# Patient Record
Sex: Female | Born: 1970 | Race: Black or African American | Hispanic: No | Marital: Single | Smoking: Former smoker
Health system: Southern US, Community
[De-identification: ages and names within clinical notes are randomized; demographics above are authoritative.]

## PROBLEM LIST (undated history)

## (undated) HISTORY — PX: OVARIAN CYST SURGERY: SHX726

---

## 1997-09-04 ENCOUNTER — Emergency Department (HOSPITAL_COMMUNITY): Admission: EM | Admit: 1997-09-04 | Discharge: 1997-09-04 | Payer: Self-pay

## 1997-09-05 ENCOUNTER — Encounter: Admission: RE | Admit: 1997-09-05 | Discharge: 1997-12-04 | Payer: Self-pay | Admitting: Internal Medicine

## 2001-07-28 ENCOUNTER — Other Ambulatory Visit: Admission: RE | Admit: 2001-07-28 | Discharge: 2001-07-28 | Payer: Self-pay | Admitting: Obstetrics and Gynecology

## 2002-08-08 ENCOUNTER — Other Ambulatory Visit: Admission: RE | Admit: 2002-08-08 | Discharge: 2002-08-08 | Payer: Self-pay | Admitting: Obstetrics and Gynecology

## 2003-01-23 ENCOUNTER — Emergency Department (HOSPITAL_COMMUNITY): Admission: EM | Admit: 2003-01-23 | Discharge: 2003-01-23 | Payer: Self-pay | Admitting: Emergency Medicine

## 2011-08-10 ENCOUNTER — Other Ambulatory Visit (HOSPITAL_COMMUNITY): Payer: Self-pay | Admitting: Obstetrics and Gynecology

## 2011-08-10 DIAGNOSIS — N979 Female infertility, unspecified: Secondary | ICD-10-CM

## 2011-08-17 ENCOUNTER — Ambulatory Visit (HOSPITAL_COMMUNITY)
Admission: RE | Admit: 2011-08-17 | Discharge: 2011-08-17 | Disposition: A | Discharge status: Home / Self Care | Payer: BC Managed Care – PPO | Source: Ambulatory Visit | Attending: Obstetrics and Gynecology | Admitting: Obstetrics and Gynecology

## 2011-08-17 DIAGNOSIS — N979 Female infertility, unspecified: Secondary | ICD-10-CM | POA: Insufficient documentation

## 2011-08-17 MED ORDER — IOHEXOL 300 MG/ML  SOLN: 5.0000 mL | Freq: Once | INTRAMUSCULAR | Status: AC | PRN

## 2014-10-12 ENCOUNTER — Telehealth: Payer: Self-pay | Admitting: Internal Medicine

## 2014-10-12 NOTE — Telephone Encounter (Signed)
Phone note entered in error 

## 2015-02-17 ENCOUNTER — Other Ambulatory Visit: Payer: Self-pay | Admitting: Obstetrics and Gynecology

## 2015-02-17 DIAGNOSIS — N63 Unspecified lump in unspecified breast: Secondary | ICD-10-CM

## 2015-02-21 ENCOUNTER — Ambulatory Visit
Admission: RE | Admit: 2015-02-21 | Discharge: 2015-02-21 | Disposition: A | Discharge status: Home / Self Care | Payer: BC Managed Care – PPO | Source: Ambulatory Visit | Attending: Obstetrics and Gynecology | Admitting: Obstetrics and Gynecology

## 2015-02-21 DIAGNOSIS — N63 Unspecified lump in unspecified breast: Secondary | ICD-10-CM

## 2016-01-04 ENCOUNTER — Emergency Department (HOSPITAL_COMMUNITY)
Admission: EM | Admit: 2016-01-04 | Discharge: 2016-01-04 | Disposition: A | Discharge status: Home / Self Care | Payer: BC Managed Care – PPO | Attending: Emergency Medicine | Admitting: Emergency Medicine

## 2016-01-04 ENCOUNTER — Encounter (HOSPITAL_COMMUNITY): Payer: Self-pay

## 2016-01-04 DIAGNOSIS — M5417 Radiculopathy, lumbosacral region: Secondary | ICD-10-CM | POA: Insufficient documentation

## 2016-01-04 DIAGNOSIS — Z87891 Personal history of nicotine dependence: Secondary | ICD-10-CM | POA: Diagnosis not present

## 2016-01-04 DIAGNOSIS — M545 Low back pain: Secondary | ICD-10-CM | POA: Diagnosis present

## 2016-01-04 MED ORDER — HYDROCODONE-ACETAMINOPHEN 5-325 MG PO TABS
2.0000 | ORAL_TABLET | Freq: Once | ORAL | Status: AC
  Administered 2016-01-04: 2 via ORAL
  Filled 2016-01-04: qty 2

## 2016-01-04 MED ORDER — PREDNISONE 10 MG PO TABS: 20.0000 mg | ORAL_TABLET | Freq: Two times a day (BID) | ORAL | 0 refills | Status: AC

## 2016-01-04 MED ORDER — HYDROCODONE-ACETAMINOPHEN 5-325 MG PO TABS: 1.0000 | ORAL_TABLET | Freq: Four times a day (QID) | ORAL | 0 refills | Status: AC | PRN

## 2016-01-04 MED ORDER — KETOROLAC TROMETHAMINE 60 MG/2ML IM SOLN
60.0000 mg | Freq: Once | INTRAMUSCULAR | Status: AC
  Administered 2016-01-04: 60 mg via INTRAMUSCULAR
  Filled 2016-01-04: qty 2

## 2016-01-04 NOTE — ED Provider Notes (Signed)
[facility]-EMERGENCY DEPT Provider Note   CSN: [id number] Arrival date & time: 01/04/16  1030     History   Chief Complaint Chief Complaint  Patient presents with  . Back Pain    HPI Margaret Soto is a 45 y.o. female.  Patient is a 45 year old female with no significant past medical history. She presents for evaluation of low back pain. This started several days ago in the absence of any injury or trauma. She denies any bowel or bladder complaints, but does report radiation into her right thigh. She denies any weakness but does have difficulty ambulating secondary to pain.   The history is provided by the patient.  Back Pain   This is a new problem. Episode onset: Several days ago. The problem occurs constantly. The problem has been gradually worsening. The pain is associated with no known injury. The pain is present in the lumbar spine. The quality of the pain is described as stabbing and shooting. The pain radiates to the right thigh. The pain is severe. The symptoms are aggravated by bending and twisting. Pertinent negatives include no numbness, no bowel incontinence, no bladder incontinence, no tingling and no weakness. She has tried NSAIDs for the symptoms. The treatment provided no relief.    History reviewed. No pertinent past medical history.  There are no active problems to display for this patient.   Past Surgical History:  Procedure Laterality Date  . OVARIAN CYST SURGERY      OB History    No data available       Home Medications    Prior to Admission medications   Not on File    Family History History reviewed. No pertinent family history.  Social History Social History  Substance Use Topics  . Smoking status: Former Games developer  . Smokeless tobacco: Never Used  . Alcohol use Yes     Allergies   Review of patient's allergies indicates no known allergies.   Review of Systems Review of Systems  Gastrointestinal: Negative for bowel incontinence.    Genitourinary: Negative for bladder incontinence.  Musculoskeletal: Positive for back pain.  Neurological: Negative for tingling, weakness and numbness.  All other systems reviewed and are negative.    Physical Exam Updated Vital Signs BP 115/88 (BP Location: Right Arm)   Pulse 66   Temp 99 F (37.2 C) (Oral)   Resp 12   Ht 5\' 4"  (1.626 m)   Wt 155 lb (70.3 kg)   LMP 11/18/2015 (Approximate)   SpO2 100%   BMI 26.61 kg/m   Physical Exam  Constitutional: She is oriented to person, place, and time. She appears well-developed and well-nourished. No distress.  HENT:  Head: Normocephalic and atraumatic.  Neck: Normal range of motion. Neck supple.  Musculoskeletal: Normal range of motion.  There is tenderness to palpation of the soft tissues of the lumbar paraspinal musculature. There is no bony tenderness or step-off.  Neurological: She is alert and oriented to person, place, and time.  Strength is 5 out of 5 in both lower extremities. DTRs are 1+ and symmetrical in the patellar and Achilles tendons bilaterally. She is able to ambulate, however with some difficulty secondary to pain.  Skin: Skin is warm and dry. She is not diaphoretic.  Nursing note and vitals reviewed.    ED Treatments / Results  Labs (all labs ordered are listed, but only abnormal results are displayed) Labs Reviewed - No data to display  EKG  EKG Interpretation None  Radiology No results found.  Procedures Procedures (including critical care time)  Medications Ordered in ED Medications  ketorolac (TORADOL) injection 60 mg (not administered)  HYDROcodone-acetaminophen (NORCO/VICODIN) 5-325 MG per tablet 2 tablet (not administered)     Initial Impression / Assessment and Plan / ED Course  I have reviewed the triage vital signs and the nursing notes.  Pertinent labs & imaging results that were available during my care of the patient were reviewed by me and considered in my medical  decision making (see chart for details).  Clinical Course    There are no red flags that would suggest an emergent situation. She will be treated with prednisone, pain medication, and when necessary follow-up with her primary Dr. if she is not improving.  Final Clinical Impressions(s) / ED Diagnoses   Final diagnoses:  None    New Prescriptions New Prescriptions   No medications on file     Geoffery Lyonsouglas Bronco Mcgrory, MD 01/04/16 1130

## 2016-01-04 NOTE — ED Triage Notes (Signed)
Pt c/o low back pain radiating down R leg x 1 week.  Pain score 10/10.  Pt reports taking Aleve and muscle relaxer w/o relief.  Denies injury.

## 2016-01-04 NOTE — Discharge Instructions (Signed)
Prednisone as prescribed.  Hydrocodone as prescribed as needed for pain.  Please follow with your primary Dr. in the next week if symptoms are not improving to discuss physical therapy or imaging studies.

## 2016-01-14 ENCOUNTER — Other Ambulatory Visit: Payer: Self-pay | Admitting: Orthopaedic Surgery

## 2016-01-14 DIAGNOSIS — M79661 Pain in right lower leg: Secondary | ICD-10-CM

## 2016-01-14 DIAGNOSIS — M7989 Other specified soft tissue disorders: Principal | ICD-10-CM

## 2016-04-06 ENCOUNTER — Other Ambulatory Visit: Payer: Self-pay | Admitting: Orthopaedic Surgery

## 2016-04-06 DIAGNOSIS — M5416 Radiculopathy, lumbar region: Secondary | ICD-10-CM

## 2016-04-14 ENCOUNTER — Other Ambulatory Visit: Payer: BC Managed Care – PPO

## 2016-04-15 ENCOUNTER — Ambulatory Visit
Admission: RE | Admit: 2016-04-15 | Discharge: 2016-04-15 | Disposition: A | Discharge status: Home / Self Care | Payer: BC Managed Care – PPO | Source: Ambulatory Visit | Attending: Orthopaedic Surgery | Admitting: Orthopaedic Surgery

## 2016-04-15 DIAGNOSIS — M5416 Radiculopathy, lumbar region: Secondary | ICD-10-CM

## 2016-05-11 ENCOUNTER — Other Ambulatory Visit: Payer: Self-pay | Admitting: Orthopaedic Surgery

## 2016-05-11 DIAGNOSIS — M542 Cervicalgia: Secondary | ICD-10-CM

## 2016-05-27 ENCOUNTER — Ambulatory Visit
Admission: RE | Admit: 2016-05-27 | Discharge: 2016-05-27 | Disposition: A | Discharge status: Home / Self Care | Payer: BC Managed Care – PPO | Source: Ambulatory Visit | Attending: Orthopaedic Surgery | Admitting: Orthopaedic Surgery

## 2016-05-27 DIAGNOSIS — M542 Cervicalgia: Secondary | ICD-10-CM

## 2020-04-16 ENCOUNTER — Ambulatory Visit (INDEPENDENT_AMBULATORY_CARE_PROVIDER_SITE_OTHER): Payer: BC Managed Care – PPO

## 2020-04-16 ENCOUNTER — Encounter: Payer: Self-pay | Admitting: Podiatry

## 2020-04-16 ENCOUNTER — Ambulatory Visit: Payer: BC Managed Care – PPO | Admitting: Podiatry

## 2020-04-16 ENCOUNTER — Other Ambulatory Visit: Payer: Self-pay

## 2020-04-16 DIAGNOSIS — M79672 Pain in left foot: Secondary | ICD-10-CM | POA: Diagnosis not present

## 2020-04-16 DIAGNOSIS — M79671 Pain in right foot: Secondary | ICD-10-CM

## 2020-04-16 NOTE — Progress Notes (Signed)
Subjective:   Patient ID: Margaret Soto, female   DOB: 50 y.o.   MRN: [id number]   HPI Patient presents stating she developed increased pain in her big toe joint left over right and states she has developed a knot on top of the big toe joint left.  States is getting increasingly sore and she is concerned about it and not able to do the activities she wants and states that she does have family history.  Patient does not smoke and likes to be active if possible   Review of Systems  All other systems reviewed and are negative.       Objective:  Physical Exam Vitals and nursing note reviewed.  Constitutional:      Appearance: She is well-developed and well-nourished.  Cardiovascular:     Pulses: Intact distal pulses.  Pulmonary:     Effort: Pulmonary effort is normal.  Musculoskeletal:        General: Normal range of motion.  Skin:    General: Skin is warm.  Neurological:     Mental Status: She is alert.     Neurovascular status found to be intact muscle strength found to be adequate range of motion adequate.  Patient is noted to have reduced range of motion first MPJ left no crepitus with large dorsal bone spur and pain with prominence around the medial side of the first metatarsal head.  Patient is found to have good digital perfusion well oriented x3 with mild deformity of the right.  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  I do think long-term correction would be best and I explained this to patient and patient would like to have this done.  I do think that correcting this could hopefully prevent long-term arthritis in the joint and patient wants surgery and at this point I allowed her to go over consent form reviewing alternative treatments complications.   Patient will have biplanar osteotomy left with also correction of the bunion same time along with metatarsal osteotomy fifth left and after extensive review signed consent form and is scheduled for outpatient surgery and is encouraged to call with questions.  She does understand there is no guarantee as to the condition of the joint and it may require implantation fusion either right away or sometime in future and that recovery can take 6 months.  Dispensed air fracture walker that I want her to get used to before surgery and practice walking and  X-rays indicate spur formation with narrowing of the joint surface elevation first metatarsal segment at the left with moderate bunion deformity bilateral

## 2020-05-09 ENCOUNTER — Telehealth: Payer: Self-pay

## 2020-05-09 NOTE — Telephone Encounter (Signed)
DOS 05/20/2020  [place] BUNIONECTOMY LT - 28296 METATARSAL OSTEOTOMY 5TH LT - 28308  [employer] ST EFFECTIVE DATE - 04/05/2020  PLAN DEDUCTIBLE - $1250.00 W/ $1250.00 REMAINING OUT OF POCKET - $4890.00 W/ $4890.00 REMAINING COPAY $600.00 COINSURANCE - 20% PER SERVICE YEAR  NO AUTH REQUIRED PER WEBSITE

## 2020-05-19 MED ORDER — ONDANSETRON HCL 4 MG PO TABS: 4.0000 mg | ORAL_TABLET | Freq: Three times a day (TID) | ORAL | 0 refills | Status: AC | PRN

## 2020-05-19 MED ORDER — OXYCODONE-ACETAMINOPHEN 10-325 MG PO TABS: 1.0000 | ORAL_TABLET | ORAL | 0 refills | Status: AC | PRN

## 2020-05-19 NOTE — Addendum Note (Signed)
Addended by: Lenn Sink on: 05/19/2020 03:28 PM   Modules accepted: Orders

## 2020-05-20 ENCOUNTER — Telehealth: Payer: Self-pay | Admitting: *Deleted

## 2020-05-20 ENCOUNTER — Encounter: Payer: Self-pay | Admitting: Podiatry

## 2020-05-20 DIAGNOSIS — M2012 Hallux valgus (acquired), left foot: Secondary | ICD-10-CM | POA: Diagnosis not present

## 2020-05-20 NOTE — Telephone Encounter (Signed)
Patient recently had surgery and is wanting how long the numbness will last in foot and feels that the bandages may be too tight, experiencing a little anxiety at the moment. Please advise.

## 2020-05-21 NOTE — Telephone Encounter (Signed)
Patient called and said that numbness is expiring,more of dull pain now, doing better.

## 2020-05-21 NOTE — Telephone Encounter (Signed)
You can talk to her about importance of elevation and that numbness can last up to 12-18 hours and that surgery went well

## 2020-05-26 ENCOUNTER — Encounter: Payer: Self-pay | Admitting: Podiatry

## 2020-05-26 ENCOUNTER — Other Ambulatory Visit: Payer: Self-pay

## 2020-05-26 ENCOUNTER — Ambulatory Visit (INDEPENDENT_AMBULATORY_CARE_PROVIDER_SITE_OTHER): Payer: BC Managed Care – PPO

## 2020-05-26 ENCOUNTER — Ambulatory Visit (INDEPENDENT_AMBULATORY_CARE_PROVIDER_SITE_OTHER): Payer: BC Managed Care – PPO | Admitting: Podiatry

## 2020-05-26 DIAGNOSIS — M79671 Pain in right foot: Secondary | ICD-10-CM | POA: Diagnosis not present

## 2020-05-26 DIAGNOSIS — M79672 Pain in left foot: Secondary | ICD-10-CM

## 2020-05-27 NOTE — Progress Notes (Signed)
Subjective:   Patient ID: Margaret Soto, female   DOB: 49 y.o.   MRN: [id number]   HPI Patient states doing very well with surgery very pleased with how things are going   ROS      Objective:  Physical Exam  Neurovascular status intact negative Denna Haggard' sign noted wound edges well coapted first fifth metatarsal with good alignment noted     Assessment:  Doing well post osteotomies left     Plan:  H&P reviewed condition and went ahead today and reapplied sterile dressing continue immobilization elevation compression and reappoint to recheck  X-rays indicate osteotomies are healing well fixation in place good alignment noted

## 2020-06-16 ENCOUNTER — Encounter: Payer: BC Managed Care – PPO | Admitting: Podiatry

## 2020-06-18 ENCOUNTER — Other Ambulatory Visit: Payer: Self-pay

## 2020-06-18 ENCOUNTER — Ambulatory Visit (INDEPENDENT_AMBULATORY_CARE_PROVIDER_SITE_OTHER): Payer: BC Managed Care – PPO | Admitting: Podiatry

## 2020-06-18 ENCOUNTER — Ambulatory Visit (INDEPENDENT_AMBULATORY_CARE_PROVIDER_SITE_OTHER): Payer: BC Managed Care – PPO

## 2020-06-18 DIAGNOSIS — M79672 Pain in left foot: Secondary | ICD-10-CM

## 2020-06-18 DIAGNOSIS — M79671 Pain in right foot: Secondary | ICD-10-CM | POA: Diagnosis not present

## 2020-06-18 NOTE — Progress Notes (Signed)
Subjective:   Patient ID: Margaret Soto, female   DOB: 50 y.o.   MRN: [id number]   HPI Patient states doing well is having some swelling in the left foot   ROS      Objective:  Physical Exam  Neurovascular status intact negative Denna Haggard' sign noted left first and fifth metatarsals healing well wound edges well coapted good alignment noted range of motion adequate first MPJ mild increase in swelling around the fifth MPJ     Assessment:  Doing well post foot surgery left with mild increase in edema consistent with 5 weeks postop     Plan:  H&P reviewed condition recommended continuation of conservative care compression elevation and immobilization and did discuss that I want her to still be careful for the next few weeks as the left fifth metatarsal is not completely healed  X-rays indicate that there is some healing to go and the left fifth metatarsal screw is intact but still some healing left to do for.  First metatarsal healing well fixation in place

## 2020-07-21 ENCOUNTER — Other Ambulatory Visit: Payer: Self-pay

## 2020-07-21 ENCOUNTER — Encounter: Payer: Self-pay | Admitting: Podiatry

## 2020-07-21 ENCOUNTER — Ambulatory Visit (INDEPENDENT_AMBULATORY_CARE_PROVIDER_SITE_OTHER): Payer: BC Managed Care – PPO | Admitting: Podiatry

## 2020-07-21 ENCOUNTER — Ambulatory Visit (INDEPENDENT_AMBULATORY_CARE_PROVIDER_SITE_OTHER): Payer: BC Managed Care – PPO

## 2020-07-21 DIAGNOSIS — Z9889 Other specified postprocedural states: Secondary | ICD-10-CM | POA: Diagnosis not present

## 2020-07-21 NOTE — Progress Notes (Signed)
Subjective:   Patient ID: Margaret Soto, female   DOB: 50 y.o.   MRN: [id number]   HPI Patient states she is continuing to improve and is having minimal discomfort now and is wearing regular shoe gear   ROS      Objective:  Physical Exam  Neurovascular status intact negative Denna Haggard' sign noted wound edges left healing well with good alignment noted good range of motion no crepitus     Assessment:  Doing well post forefoot reconstruction left      Plan:  H&P reviewed condition and discussed that there will still be some swelling around the fifth metatarsal but will heal uneventfully at over time and she needs to be patient.  The bone is healing by secondary intent the first metatarsal looks excellent with good correction  X-rays indicate osteotomies are fixated there is been some movement of the fifth metatarsal left but is healing with secondary intention and will remodel itself

## 2021-04-07 ENCOUNTER — Other Ambulatory Visit: Payer: Self-pay | Admitting: Rehabilitation

## 2021-04-07 DIAGNOSIS — M5416 Radiculopathy, lumbar region: Secondary | ICD-10-CM

## 2021-05-13 ENCOUNTER — Ambulatory Visit
Admission: RE | Admit: 2021-05-13 | Discharge: 2021-05-13 | Disposition: A | Discharge status: Home / Self Care | Payer: Self-pay | Source: Ambulatory Visit | Attending: Rehabilitation | Admitting: Rehabilitation

## 2021-05-13 ENCOUNTER — Other Ambulatory Visit: Payer: Self-pay

## 2021-05-13 DIAGNOSIS — M5416 Radiculopathy, lumbar region: Secondary | ICD-10-CM

## 2022-05-04 ENCOUNTER — Other Ambulatory Visit: Payer: Self-pay | Admitting: Student in an Organized Health Care Education/Training Program

## 2022-05-04 DIAGNOSIS — M25561 Pain in right knee: Secondary | ICD-10-CM

## 2022-05-23 ENCOUNTER — Inpatient Hospital Stay: Admission: RE | Admit: 2022-05-23 | Payer: Self-pay | Source: Ambulatory Visit

## 2022-05-30 ENCOUNTER — Ambulatory Visit
Admission: RE | Admit: 2022-05-30 | Discharge: 2022-05-30 | Disposition: A | Discharge status: Home / Self Care | Payer: Self-pay | Source: Ambulatory Visit | Attending: Student in an Organized Health Care Education/Training Program | Admitting: Student in an Organized Health Care Education/Training Program

## 2022-05-30 DIAGNOSIS — M25561 Pain in right knee: Secondary | ICD-10-CM

## 2023-02-17 ENCOUNTER — Encounter: Payer: Self-pay | Admitting: Obstetrics and Gynecology

## 2023-02-17 ENCOUNTER — Other Ambulatory Visit: Payer: Self-pay | Admitting: Obstetrics and Gynecology

## 2023-02-17 DIAGNOSIS — R928 Other abnormal and inconclusive findings on diagnostic imaging of breast: Secondary | ICD-10-CM

## 2023-03-08 ENCOUNTER — Ambulatory Visit: Payer: BC Managed Care – PPO

## 2023-03-08 ENCOUNTER — Ambulatory Visit
Admission: RE | Admit: 2023-03-08 | Discharge: 2023-03-08 | Disposition: A | Discharge status: Home / Self Care | Payer: BC Managed Care – PPO | Source: Ambulatory Visit | Attending: Obstetrics and Gynecology | Admitting: Obstetrics and Gynecology

## 2023-03-08 DIAGNOSIS — R928 Other abnormal and inconclusive findings on diagnostic imaging of breast: Secondary | ICD-10-CM

## 2023-05-27 IMAGING — MR MR LUMBAR SPINE W/O CM
4 of 5 series · 24 of 48 positions shown · non-contrast
Comparison: 04/15/2016

CLINICAL DATA: Back pain which is chronic and worsening over the
last several years.

EXAM:
MRI LUMBAR SPINE WITHOUT CONTRAST
TECHNIQUE: Multiplanar, multisequence MR imaging of the lumbar spine was
performed. No intravenous contrast was administered.

[Series 2: T2 · sagittal · 4.0mm · 0.53mm/px · 6 of 13 slices shown (1 of 2)]
[im 1/13]
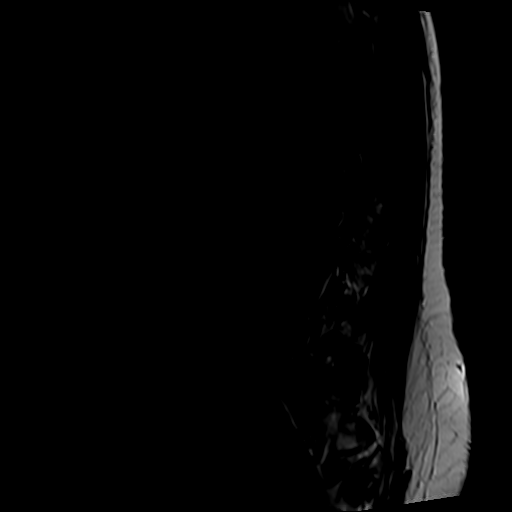
[im 3/13]
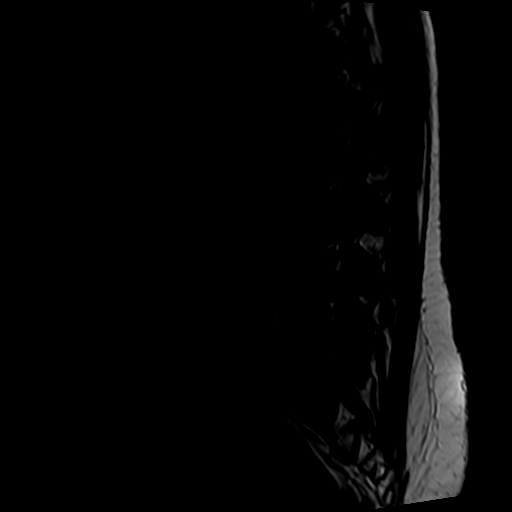
[im 5/13]
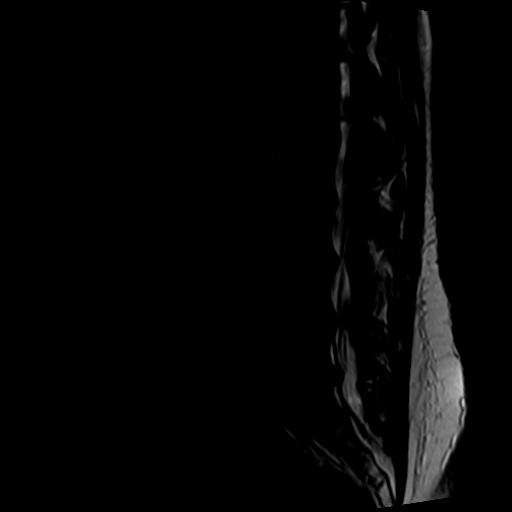
[im 8/13]
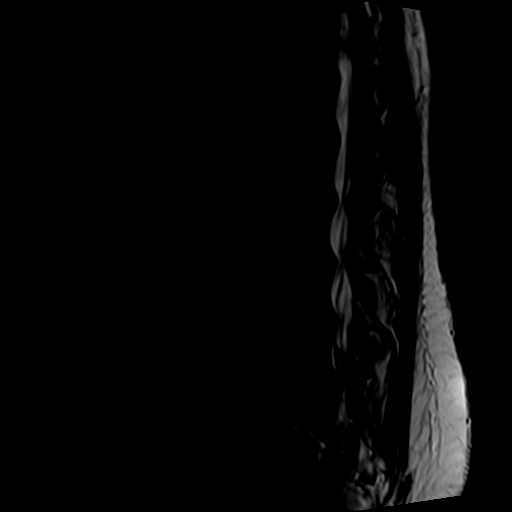
[im 10/13]
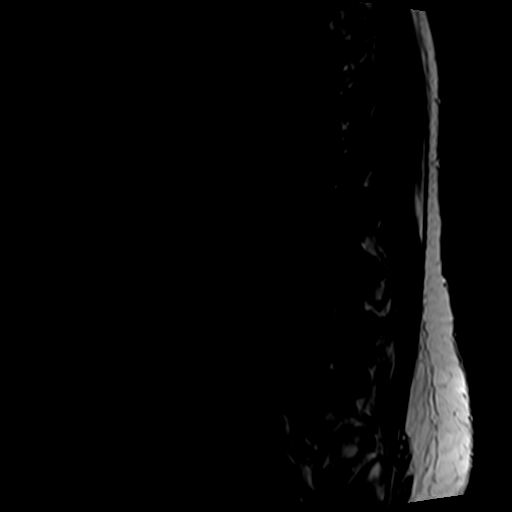
[im 13/13]
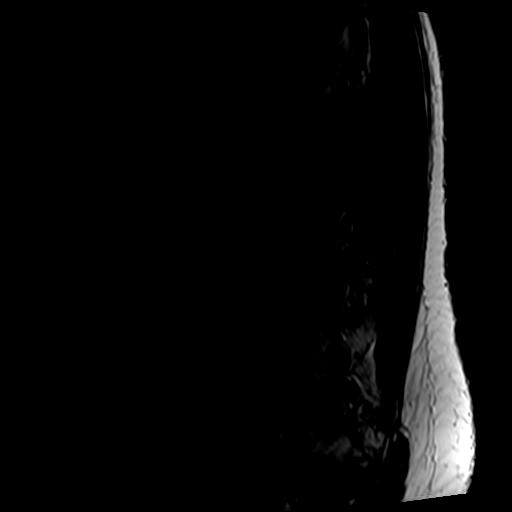

[Series 4: T1 · sagittal · 4.0mm · 0.53mm/px · 6 of 13 slices shown (1 of 2)]
[im 1/13]
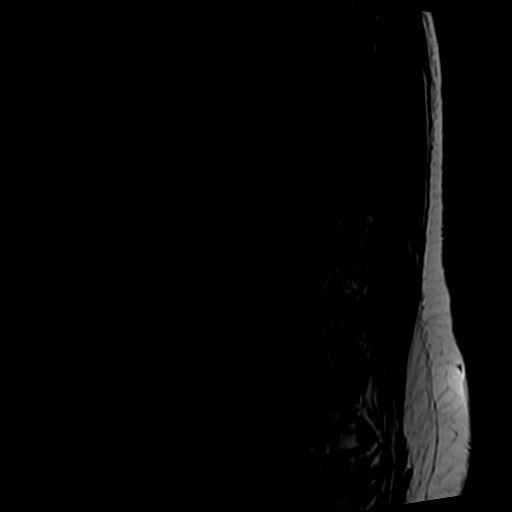
[im 3/13]
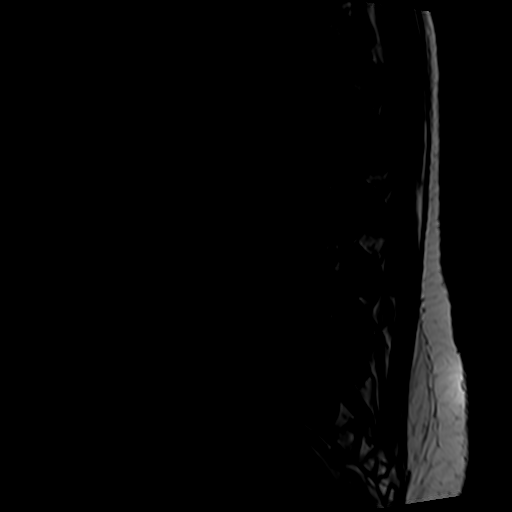
[im 5/13]
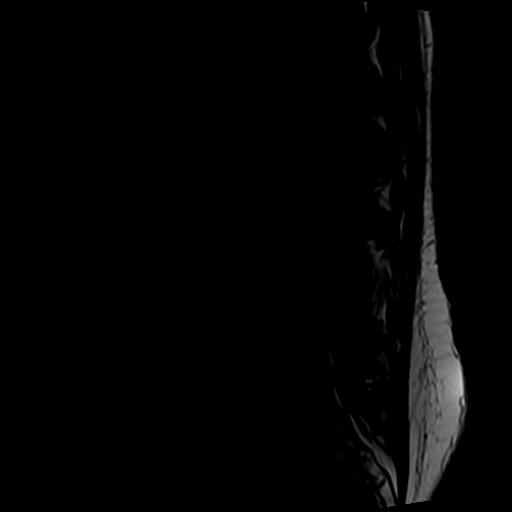
[im 8/13]
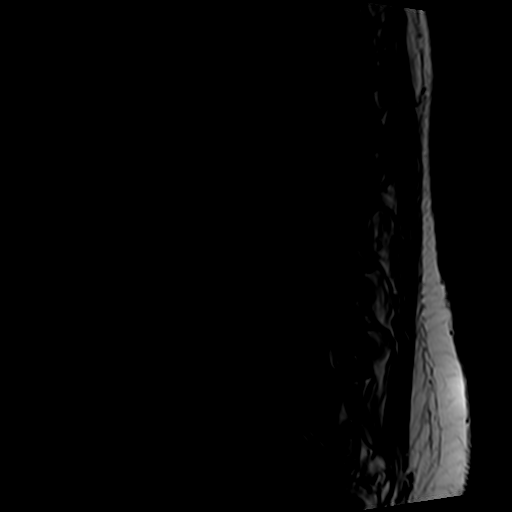
[im 10/13]
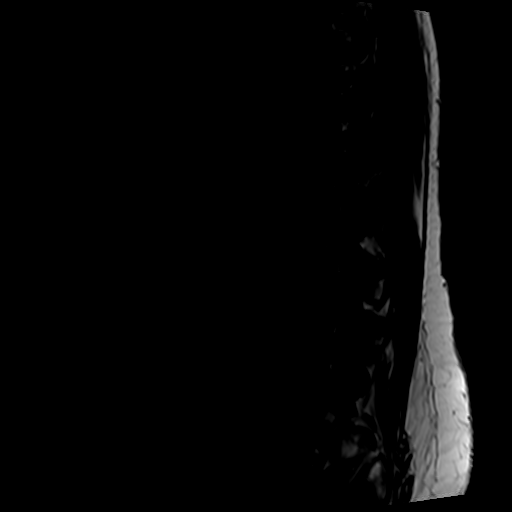
[im 13/13]
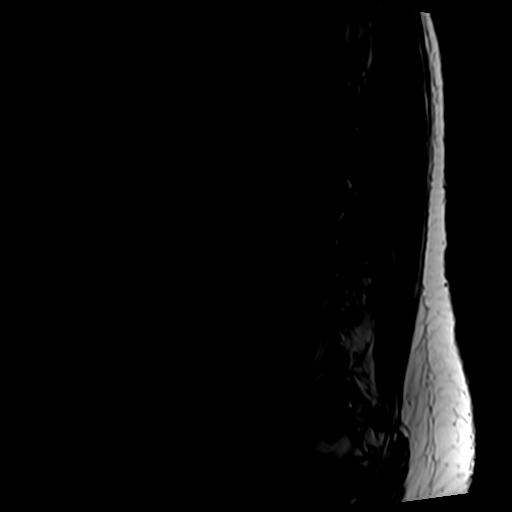

[Series 5: T2 · axial · 4.0mm · 0.70mm/px · z∈[-42,+143]mm · 9 of 34 slices shown (2 of 2)]
[im 1/34]
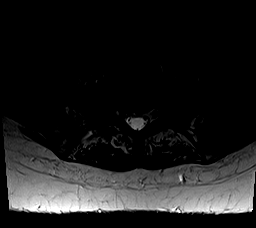
[im 5/34]
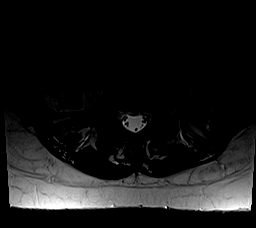
[im 10/34]
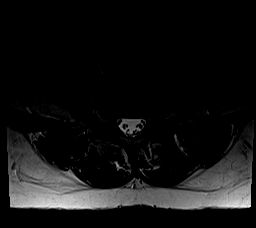
[im 15/34]
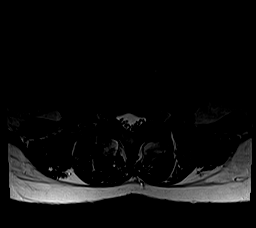
[im 17/34]
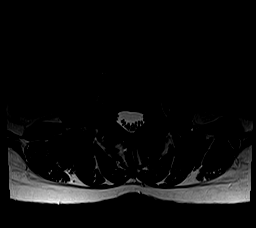
[im 19/34]
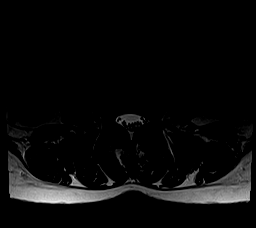
[im 24/34]
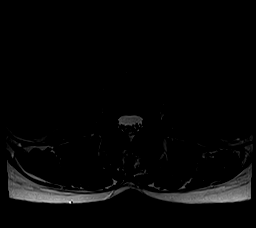
[im 29/34]
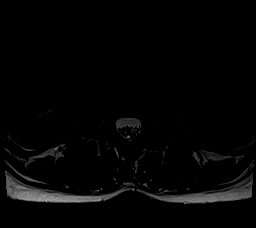
[im 34/34]
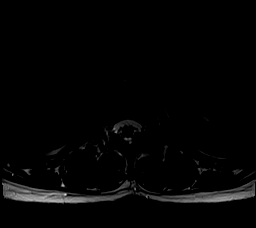

[Series 6: T1 · axial · 4.0mm · 0.35mm/px · z∈[-22,+118]mm · 3 of 34 slices shown (2 of 2)]
[im 5/34]
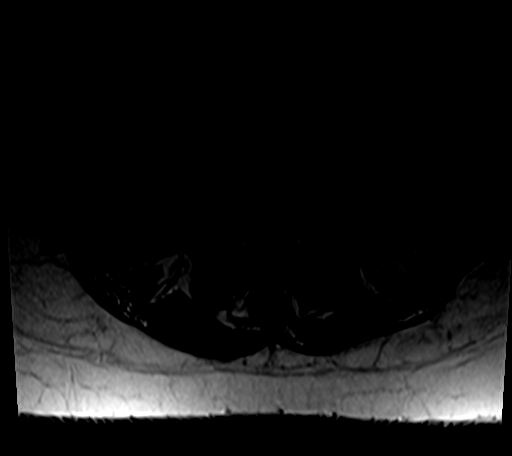
[im 17/34]
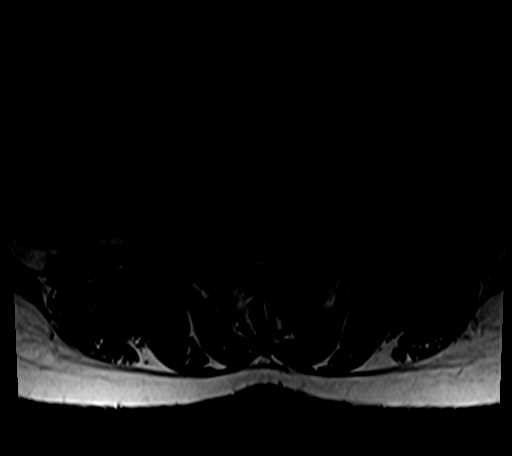
[im 29/34]
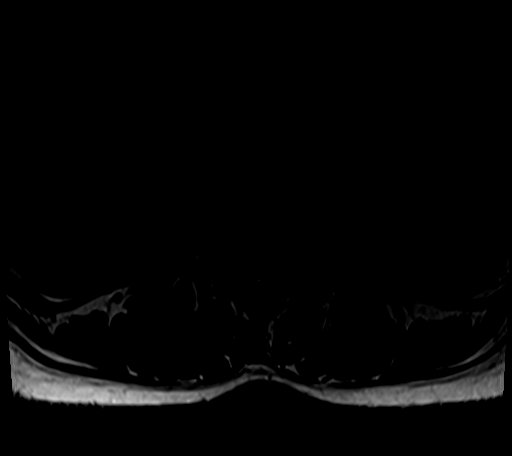

[24 of 48 positions shown; findings below may reference images not displayed]

FINDINGS: Segmentation:  5 lumbar type vertebral bodies.

Alignment: Straightening of the normal lumbar lordosis. 1 mm
degenerative anterolisthesis L3-4.

Vertebrae: No fracture or focal bone lesion. Discogenic endplate
marrow edema at L5-S1 would likely correlate with localized back
pain.

Conus medullaris and cauda equina: Conus extends to the L1 level.
Conus and cauda equina appear normal.

Paraspinal and other soft tissues: Negative

Disc levels:

L1-2: Normal appearance of the disc. Mild facet and ligamentous
hypertrophy. No compressive stenosis.

L2-3: Mild bulging of the disc with a chronic right foraminal to
extraforaminal protrusion, unchanged since 7835. Mild facet and
ligamentous hypertrophy. There is some edematous change of the
facets as seen previously, which could contribute to back pain. The
exiting right L2 nerve could be affected.

L3-4: Bilateral facet osteoarthritis with 1 mm of anterolisthesis.
Mild edematous change of the facets. Bulging of the disc with
bilateral foraminal encroachment, right more than left. The facet
arthritis could be painful. There would be potential for compression
of either or both exiting L3 nerves, more likely on the right. This
right foraminal disc has involuted slightly since 7835.

L4-5: Mild bulging of the disc. Bilateral facet degeneration and
hypertrophy. Mild lateral recess and foraminal narrowing but without
visible neural compression. The facet arthritis could be painful.
Similar appearance to the prior exam.

L5-S1: Chronic and progressive disc degeneration with loss of disc
height. Endplate osteophytes and shallow herniation of the disc with
slight caudal migration to the right of midline. Worsening
discogenic endplate edema as noted above, which could relate to back
pain. Foraminal narrowing, left more than right which could possibly
affect the exiting L5 nerve. Bilateral subarticular lateral recess
stenosis, worse on the right than the left, because of the caudally
migrated disc material to the right of midline. Either S1 nerve
could be affected, more likely the right.
IMPRESSION: L2-3: Disc bulging chronic right foraminal to extraforaminal
protrusion, unchanged since 7835. Facet osteoarthritis with some
edematous change. The facet arthritis could be painful. The exiting
right L2 nerve could be affected.

L3-4: Bilateral facet osteoarthritis with 1 mm of anterolisthesis.
Bulging of the disc with bilateral foraminal encroachment, right
more than left. Facet arthritis could be painful. Either or both
exiting L3 nerves could be affected, more likely on the right. This
right foraminal disc has involuted slightly since 7835.

L4-5: Disc bulge. Facet degeneration. No visible neural compression.
The facet arthritis could be symptomatic.

L5-S1: Worsening of degenerative disc disease. Loss of disc height.
Endplate marrow edema could relate to back pain. Shallow herniation
of the disc with caudal migration to the right of midline. Bilateral
subarticular lateral recess stenosis could affect either S1 nerve,
more likely the right. Some foraminal narrowing, left than right.

## 2023-12-23 ENCOUNTER — Other Ambulatory Visit: Payer: Self-pay | Admitting: Obstetrics and Gynecology

## 2023-12-23 DIAGNOSIS — Z1231 Encounter for screening mammogram for malignant neoplasm of breast: Secondary | ICD-10-CM

## 2024-03-09 ENCOUNTER — Ambulatory Visit

## 2024-03-30 ENCOUNTER — Ambulatory Visit

## 2024-04-02 ENCOUNTER — Ambulatory Visit
Admission: RE | Admit: 2024-04-02 | Discharge: 2024-04-02 | Disposition: A | Source: Ambulatory Visit | Attending: Obstetrics and Gynecology | Admitting: Obstetrics and Gynecology

## 2024-04-02 DIAGNOSIS — Z1231 Encounter for screening mammogram for malignant neoplasm of breast: Secondary | ICD-10-CM
